# Patient Record
Sex: Female | Born: 1980 | Hispanic: Yes | Marital: Married | Smoking: Never smoker
Health system: Southern US, Community
[De-identification: ages and names within clinical notes are randomized; demographics above are authoritative.]

---

## 2015-02-15 ENCOUNTER — Encounter (HOSPITAL_COMMUNITY): Payer: Self-pay | Admitting: Emergency Medicine

## 2015-02-15 ENCOUNTER — Emergency Department (HOSPITAL_COMMUNITY)
Admission: EM | Admit: 2015-02-15 | Discharge: 2015-02-15 | Disposition: A | Discharge status: Home / Self Care | Payer: Self-pay | Attending: Emergency Medicine | Admitting: Emergency Medicine

## 2015-02-15 ENCOUNTER — Emergency Department (HOSPITAL_COMMUNITY): Payer: Self-pay

## 2015-02-15 DIAGNOSIS — R5383 Other fatigue: Secondary | ICD-10-CM | POA: Insufficient documentation

## 2015-02-15 DIAGNOSIS — R509 Fever, unspecified: Secondary | ICD-10-CM | POA: Insufficient documentation

## 2015-02-15 DIAGNOSIS — Z3202 Encounter for pregnancy test, result negative: Secondary | ICD-10-CM | POA: Insufficient documentation

## 2015-02-15 DIAGNOSIS — R519 Headache, unspecified: Secondary | ICD-10-CM

## 2015-02-15 DIAGNOSIS — R51 Headache: Secondary | ICD-10-CM | POA: Insufficient documentation

## 2015-02-15 DIAGNOSIS — R079 Chest pain, unspecified: Secondary | ICD-10-CM | POA: Insufficient documentation

## 2015-02-15 DIAGNOSIS — Z7982 Long term (current) use of aspirin: Secondary | ICD-10-CM | POA: Insufficient documentation

## 2015-02-15 LAB — URINALYSIS, ROUTINE W REFLEX MICROSCOPIC
Bilirubin Urine: NEGATIVE
Glucose, UA: NEGATIVE mg/dL
Hgb urine dipstick: NEGATIVE
KETONES UR: NEGATIVE mg/dL
NITRITE: NEGATIVE
PH: 6.5 (ref 5.0–8.0)
PROTEIN: NEGATIVE mg/dL
Specific Gravity, Urine: 1.022 (ref 1.005–1.030)

## 2015-02-15 LAB — BASIC METABOLIC PANEL
Anion gap: 10 (ref 5–15)
BUN: 10 mg/dL (ref 6–20)
CHLORIDE: 104 mmol/L (ref 101–111)
CO2: 26 mmol/L (ref 22–32)
Calcium: 9.4 mg/dL (ref 8.9–10.3)
Creatinine, Ser: 0.73 mg/dL (ref 0.44–1.00)
GFR calc Af Amer: >60 mL/min (ref >60)
GFR calc non Af Amer: >60 mL/min (ref >60)
Glucose, Bld: 102 mg/dL — ABNORMAL HIGH (ref 65–99)
POTASSIUM: 4 mmol/L (ref 3.5–5.1)
SODIUM: 140 mmol/L (ref 135–145)

## 2015-02-15 LAB — CBC
HEMATOCRIT: 41.2 % (ref 36.0–46.0)
HEMOGLOBIN: 14.1 g/dL (ref 12.0–15.0)
MCH: 31.3 pg (ref 26.0–34.0)
MCHC: 34.2 g/dL (ref 30.0–36.0)
MCV: 91.6 fL (ref 78.0–100.0)
Platelets: 183 10*3/uL (ref 150–400)
RBC: 4.5 MIL/uL (ref 3.87–5.11)
RDW: 12.8 % (ref 11.5–15.5)
WBC: 5.4 10*3/uL (ref 4.0–10.5)

## 2015-02-15 LAB — I-STAT BETA HCG BLOOD, ED (MC, WL, AP ONLY)

## 2015-02-15 LAB — URINE MICROSCOPIC-ADD ON
RBC / HPF: NONE SEEN RBC/hpf (ref 0–5)
WBC UA: NONE SEEN WBC/hpf (ref 0–5)

## 2015-02-15 LAB — I-STAT TROPONIN, ED: Troponin i, poc: 0 ng/mL (ref 0.00–0.08)

## 2015-02-15 MED ORDER — METOCLOPRAMIDE HCL 5 MG/ML IJ SOLN
10.0000 mg | Freq: Once | INTRAMUSCULAR | Status: AC
  Administered 2015-02-15: 10 mg via INTRAVENOUS
  Filled 2015-02-15: qty 2

## 2015-02-15 MED ORDER — SODIUM CHLORIDE 0.9 % IV BOLUS (SEPSIS)
1000.0000 mL | Freq: Once | INTRAVENOUS | Status: AC
  Administered 2015-02-15: 1000 mL via INTRAVENOUS

## 2015-02-15 MED ORDER — KETOROLAC TROMETHAMINE 30 MG/ML IJ SOLN
30.0000 mg | Freq: Once | INTRAMUSCULAR | Status: AC
  Administered 2015-02-15: 30 mg via INTRAVENOUS
  Filled 2015-02-15: qty 1

## 2015-02-15 NOTE — ED Notes (Signed)
Via Interpreter phone: 3 weeks of pain all over body and headaches; yesterday a small pain in left side of chest. There has been no additional episodes of the chest pain. Denies any SOB. Denies any CP or SOB with exertion. States when she has the headache she only wants to lie in bed, feels weak all over. Denies one sided weakness, denies vision changes. Reports bilateral hand numbness only at night. Denies abdominal pain.

## 2015-02-15 NOTE — Discharge Instructions (Signed)
Dolor de cabeza general sin causa (General Headache Without Cause) El dolor de cabeza es un dolor o malestar que se siente en la zona de la cabeza o del cuello. Puede no tener una causa especfica. Hay muchas causas y tipos de dolores de [place]. Los dolores de cabeza ms comunes son los siguientes:  Cefalea tensional.  Cefaleas migraosas.  Cefalea en brotes.  Cefaleas diarias crnicas. INSTRUCCIONES PARA EL CUIDADO EN EL HOGAR  Controle su afeccin para ver si hay cambios. Siga estos pasos para Scientist, physiological afeccin: Control del [employer] medicamentos de venta libre y los recetados solamente como se lo haya indicado el mdico.  Cuando sienta dolor de cabeza acustese en un cuarto oscuro y tranquilo.  Si se lo indican, aplique hielo sobre la cabeza y la zona del cuello:  Ponga el hielo en una bolsa plstica.  Coloque una toalla entre la piel y la bolsa de hielo.  Coloque el hielo durante , 2 a 3veces por Futures trader.  Utilice una almohadilla trmica o tome una ducha con agua caliente para aplicar calor en la cabeza y la zona del cuello como se lo haya indicado el mdico.  Mantenga las luces tenues si le [employer] luces brillantes o sus dolores de cabeza empeoran. Comida y bebida  Mantenga un horario para las comidas.  Limite el consumo de bebidas alcohlicas.  Consuma menos cantidad de cafena o deje de tomarla. Instrucciones generales  Concurra a todas las visitas de control como se lo haya indicado el mdico. Esto es importante.  Lleve un diario de los dolores de cabeza para Financial risk analyst qu factores pueden desencadenarlos. Por ejemplo, escriba los siguientes datos:  Lo que usted come y Estate agent.  Cunto tiempo duerme.  Algn cambio en su dieta o en los medicamentos.  Pruebe algunas tcnicas de relajacin, como los [place].  Limite el estrs.  Sintese con la espalda recta y no tense los msculos.  No consuma productos que contengan tabaco, incluidos  cigarrillos, tabaco de Theatre manager o cigarrillos electrnicos. Si necesita ayuda para dejar de fumar, consulte al mdico.  Haga actividad fsica habitualmente como se lo haya indicado el mdico.  Tenga un horario fijo para dormir. Duerma entre 7 y 9horas o la cantidad de horas que le haya recomendado el mdico. SOLICITE ATENCIN MDICA SI:   Los medicamentos no Materials engineer los sntomas.  Tiene un dolor de cabeza que es diferente del dolor de cabeza habitual.  Tiene nuseas o vmitos.  Tiene fiebre. SOLICITE ATENCIN MDICA DE INMEDIATO SI:   El dolor se hace cada vez ms intenso.  Ha vomitado repetidas veces.  Presenta rigidez en el cuello.  Sufre prdida de la visin.  Tiene problemas para hablar.  Siente dolor en el ojo o en el odo.  Presenta debilidad muscular o prdida del control muscular.  Pierde el equilibrio o tiene problemas para Advertising account planner.  Sufre mareos o se desmaya.  Se siente confundido.   Esta informacin no tiene Theme park manager el consejo del mdico. Asegrese de hacerle al mdico cualquier pregunta que tenga.   Document Released: 10/07/2004 Document Revised: 09/18/2014 Elsevier Interactive Patient Education 2016 [employer].  Dolor de pecho inespecfico (Nonspecific Chest Pain) Suele ser difcil encontrar la causa del dolor de [place]. Siempre existe una posibilidad de que el dolor est relacionado con algo grave, como un infarto de miocardio o un cogulo sanguneo en los pulmones. Hay muchas enfermedades que no son potencialmente mortales que pueden causar dolor de [place]. Es importante  que concurra a las visitas de control con el mdico.  CUIDADOS EN EL HOGAR  Si le recetaron antibiticos, asegrese de terminarlos, incluso si comienza a sentirse mejor.  Evite las [employer] causen dolor de [place].  No consuma ningn producto que contenga tabaco, lo que incluye cigarrillos, tabaco de Theatre manager o Administrator, Civil Service. Si necesita ayuda para  dejar de fumar, consulte al mdico.  No beba alcohol.  Tome los medicamentos solamente como se lo haya indicado el mdico.  Concurra a todas las visitas de control como se lo haya indicado el mdico. Esto es importante. Esto incluye otros estudios si el dolor de pecho no desaparece.  El mdico puede indicarle que mantenga la cabeza levantada (elevada) mientras duerme.  Haga cambios en su estilo de vida segn las indicaciones del mdico. Estos pueden incluir lo siguiente:  Education administrator actividad fsica con regularidad. Pdale al mdico que le sugiera algunas actividades que sean seguras para usted.  Consumir una dieta cardiosaludable. El mdico o un especialista en alimentacin (nutricionista) pueden ayudarlo a que haga elecciones saludables.  Mantener un peso saludable.  Controlar la diabetes, si es necesario.  Reducir las situaciones de estrs. SOLICITE AYUDA SI:  El dolor de pecho no desaparece, incluso despus del tratamiento.  Tiene una erupcin cutnea con ampollas en el pecho.  Tiene fiebre. SOLICITE AYUDA DE INMEDIATO SI:  El dolor en el pecho es ms intenso.  La tos empeora, o expectora sangre.  Siente un dolor intenso en el vientre (abdomen).  Se siente muy dbil.  Pierde el conocimiento (se desmaya).  Tiene escalofros.  Tiene una molestia repentina e inexplicable en el pecho.  Tiene molestias repentinas e [employer], la espalda, el cuello o la [place].  Le falta el aire en cualquier momento.  Comienza a sudar de [place] repentina o la piel se le humedece.  Siente nuseas.  Vomita.  Se siente repentinamente mareado o se desmaya.  Siente que el corazn comienza a latir rpidamente o que se saltea latidos. Estos sntomas pueden Customer service manager. No espere hasta que los sntomas desaparezcan. Solicite atencin mdica de inmediato. Comunquese con el servicio de emergencias de su localidad (911 en los [place]). No conduzca por  sus propios medios [employer].   Esta informacin no tiene Theme park manager el consejo del mdico. Asegrese de hacerle al mdico cualquier pregunta que tenga.   Document Released: 03/26/2008 Document Revised: 01/18/2014 Elsevier Interactive Patient Education [employer]

## 2015-02-15 NOTE — ED Provider Notes (Signed)
CSN: [id number]     Arrival date & time 02/15/15  1147 History   First MD Initiated Contact with Patient 02/15/15 1228     Chief Complaint  Patient presents with  . Chest Pain  . Headache  . Fever     (Consider location/radiation/quality/duration/timing/severity/associated sxs/prior Treatment) HPI History obtained via spanish interpreter.  34 year old female who presents with headache , increased fatigue, an episode of chest pain. She has no significant past medical history. States about 3 weeks ago she began to have generalized fatigue, chills, aches and pains in her muscles. Felt like she had wanted to sleep all of the time and was just very tired. 2 weeks ago began developing daily headaches, that, you ago. States that she tends to notice some more towards the end of the day. Headache described as pressure and soreness in the back of her head , top of her neck, and over  Forehead. Not associated with nausea, vomiting, vision or speech changes, focal numbness or weakness , difficulty walking or vertigo. Does not change with position, but notes that it improved briefly when she got a neck massage from her husband. Yesterday while talking on the phone, had a brief episode of pain in the left side of her chest, that lasted for less than a second and disappeared. Denies any associating syncope or near syncope, diaphoresis, shortness of breath, lower extremity swelling or pain, back pain or abdominal pain. Denies urinary complaints, GI complaints, fever. No melena or hematochezia. No heavy menses. States she has had many increased stressors at home but unsure if there was medical cause of symptoms.  History reviewed. No pertinent past medical history. History reviewed. No pertinent past surgical history. History reviewed. No pertinent family history. Social History  Substance Use Topics  . Smoking status: Never Smoker   . Smokeless tobacco: None  . Alcohol Use: No   OB History    No data  available     Review of Systems 10/14 systems reviewed and are negative other than those stated in the HPI    Allergies  Review of patient's allergies indicates no known allergies.  Home Medications   Prior to Admission medications   Medication Sig Start Date End Date Taking? Authorizing Provider  aspirin 81 MG tablet Take 81 mg by mouth daily as needed for pain.   Yes Historical Provider, MD   BP 109/71 mmHg  Pulse 85  Temp(Src) 97.9 F (36.6 C) (Oral)  Resp 18  Ht  (1.651 m)  Wt 148 lb 9 oz (67.388 kg)  BMI 24.72 kg/m2  SpO2 100%  LMP 01/15/2015 (LMP Unknown) Physical Exam   Physical Exam  Nursing note and vitals reviewed. Constitutional: Well developed, well nourished, non-toxic, and in no acute distress Head: Normocephalic and atraumatic.  Mouth/Throat: Oropharynx is clear and moist.  Neck: Normal range of motion. Neck supple.  No meningismus Cardiovascular: Normal rate and regular rhythm.   Pulmonary/Chest: Effort normal and breath sounds normal.  Abdominal: Soft. There is no tenderness. There is no rebound and no guarding.  Musculoskeletal: Normal range of motion.  Skin: Skin is warm and dry.  Psychiatric: Cooperative Neurological:  Alert, oriented to person, place, time, and situation. Memory grossly in tact. Fluent speech. No dysarthria or aphasia.  Cranial nerves: VF are full. Fundoscopic exam-unable to get good visualization of the discs. Pupils are symmetric, and reactive to light. EOMI without nystagmus. No gaze deviation. Facial muscles symmetric with activation. Sensation to light touch over face in  tact bilaterally. Hearing grossly in tact. Palate elevates symmetrically. Head turn and shoulder shrug are intact. Tongue midline.  Reflexes defered.  Muscle bulk and tone normal. No pronator drift. Moves all extremities symmetrically. Sensation to light touch is in tact throughout in bilateral upper and lower extremities. Coordination reveals no  dysmetria with finger to nose. Gait is narrow-based and steady. Non-ataxic.  ED Course  Procedures (including critical care time) Labs Review Labs Reviewed  BASIC METABOLIC PANEL - Abnormal; Notable for the following:    Glucose, Bld 102 (*)    All other components within normal limits  URINALYSIS, ROUTINE W REFLEX MICROSCOPIC (NOT AT [facility]) - Abnormal; Notable for the following:    Leukocytes, UA SMALL (*)    All other components within normal limits  URINE MICROSCOPIC-ADD ON - Abnormal; Notable for the following:    Squamous Epithelial / LPF 6-30 (*)    Bacteria, UA RARE (*)    All other components within normal limits  CBC  I-STAT TROPOININ, ED  I-STAT BETA HCG BLOOD, ED ([facility], [facility], [facility] ONLY)    Imaging Review Dg Chest 2 View  02/15/2015  CLINICAL DATA:  Headaches for 3 weeks.  Left-sided chest pain. EXAM: CHEST  2 VIEW COMPARISON:  None. FINDINGS: The heart size and mediastinal contours are within normal limits. Both lungs are clear. The visualized skeletal structures are unremarkable. IMPRESSION: No active cardiopulmonary disease. Electronically Signed   By: Richarda Overlie M.D.   On: 02/15/2015 12:39   Ct Head Wo Contrast  02/15/2015  CLINICAL DATA:  Headache x3 weeks EXAM: CT HEAD WITHOUT CONTRAST TECHNIQUE: Contiguous axial images were obtained from the base of the skull through the vertex without intravenous contrast. COMPARISON:  None. FINDINGS: Brain: No evidence of acute infarction, hemorrhage, extra-axial collection, ventriculomegaly, or mass effect. Vascular: No hyperdense vessel or unexpected calcification. Skull: Negative for fracture or focal lesion. Sinuses/Orbits: No acute findings. Other: None. IMPRESSION: 1. Negative for bleed or other acute intracranial process. Electronically Signed   By: Corlis Leak M.D.   On: 02/15/2015 14:04   I have personally reviewed and evaluated these images and lab results as part of my medical decision-making.   EKG Interpretation   Date/Time:   Saturday February 15 2015 11:58:56 EST Ventricular Rate:  77 PR Interval:  128 QRS Duration: 82 QT Interval:  394 QTC Calculation: 445 R Axis:   82 Text Interpretation:  Normal sinus rhythm Normal ECG Confirmed by Sarely Stracener MD,  Annabelle Harman ([id number]) on 02/15/2015 2:26:52 PM      MDM   Final diagnoses:  Acute nonintractable headache, unspecified headache type  Chest pain, unspecified chest pain type    35 year old female , otherwise healthy, who presents with headache, fatigue, and less than 1 second episode of chest pain. Is well-appearing and in no acute distress on presentation. Vital signs are non-concerning. The intact neuro exam. Has a unremarkable cardiopulmonary exam.   in regards to her headache, atypical that it has been daily For the past 2 weeks. CT head was performed, showing no acute intracranial processes. Was given a dose of Toradol and Reglan with IV fluids with relief of her headache.  Features of history not concerning for that of subarachnoid hemorrhage , infectious, or other serious  Etiology at this time.  Seems more related to underlying increased stressors recently.   Chest pain atypical, and less than 1 second. Has not had any recurrence of this pain. Has a  Normal EKG, and normal chest x-ray. Troponin 1  is negative. No significant risk factors for ACS and pain is atypical for that. No other concern for other serious causes of her chest pain either.   Basic blood work reveals no major metabolic or electrolyte derangements. She has normal hemoglobin. No concern for infection.   at this time, there appears to be no serious etiology of her symptoms. I discussed supportive care for home. Strict return follow-up instructions are reviewed. She expressed understanding of all discharge instructions and felt comfortable with the plan of care.   Lavera Guise, MD 02/15/15 [phone]

## 2015-02-15 NOTE — ED Notes (Signed)
Pt c/o headache x 3 weeks, left sided chest pain onset last night and numbness to left hand.

## 2017-08-10 IMAGING — CR DG CHEST 2V
2 series · 2 of 2 positions shown · non-contrast
Comparison: None.

CLINICAL DATA: Headaches for 3 weeks.  Left-sided chest pain.

EXAM:
CHEST  2 VIEW

[chest pa]
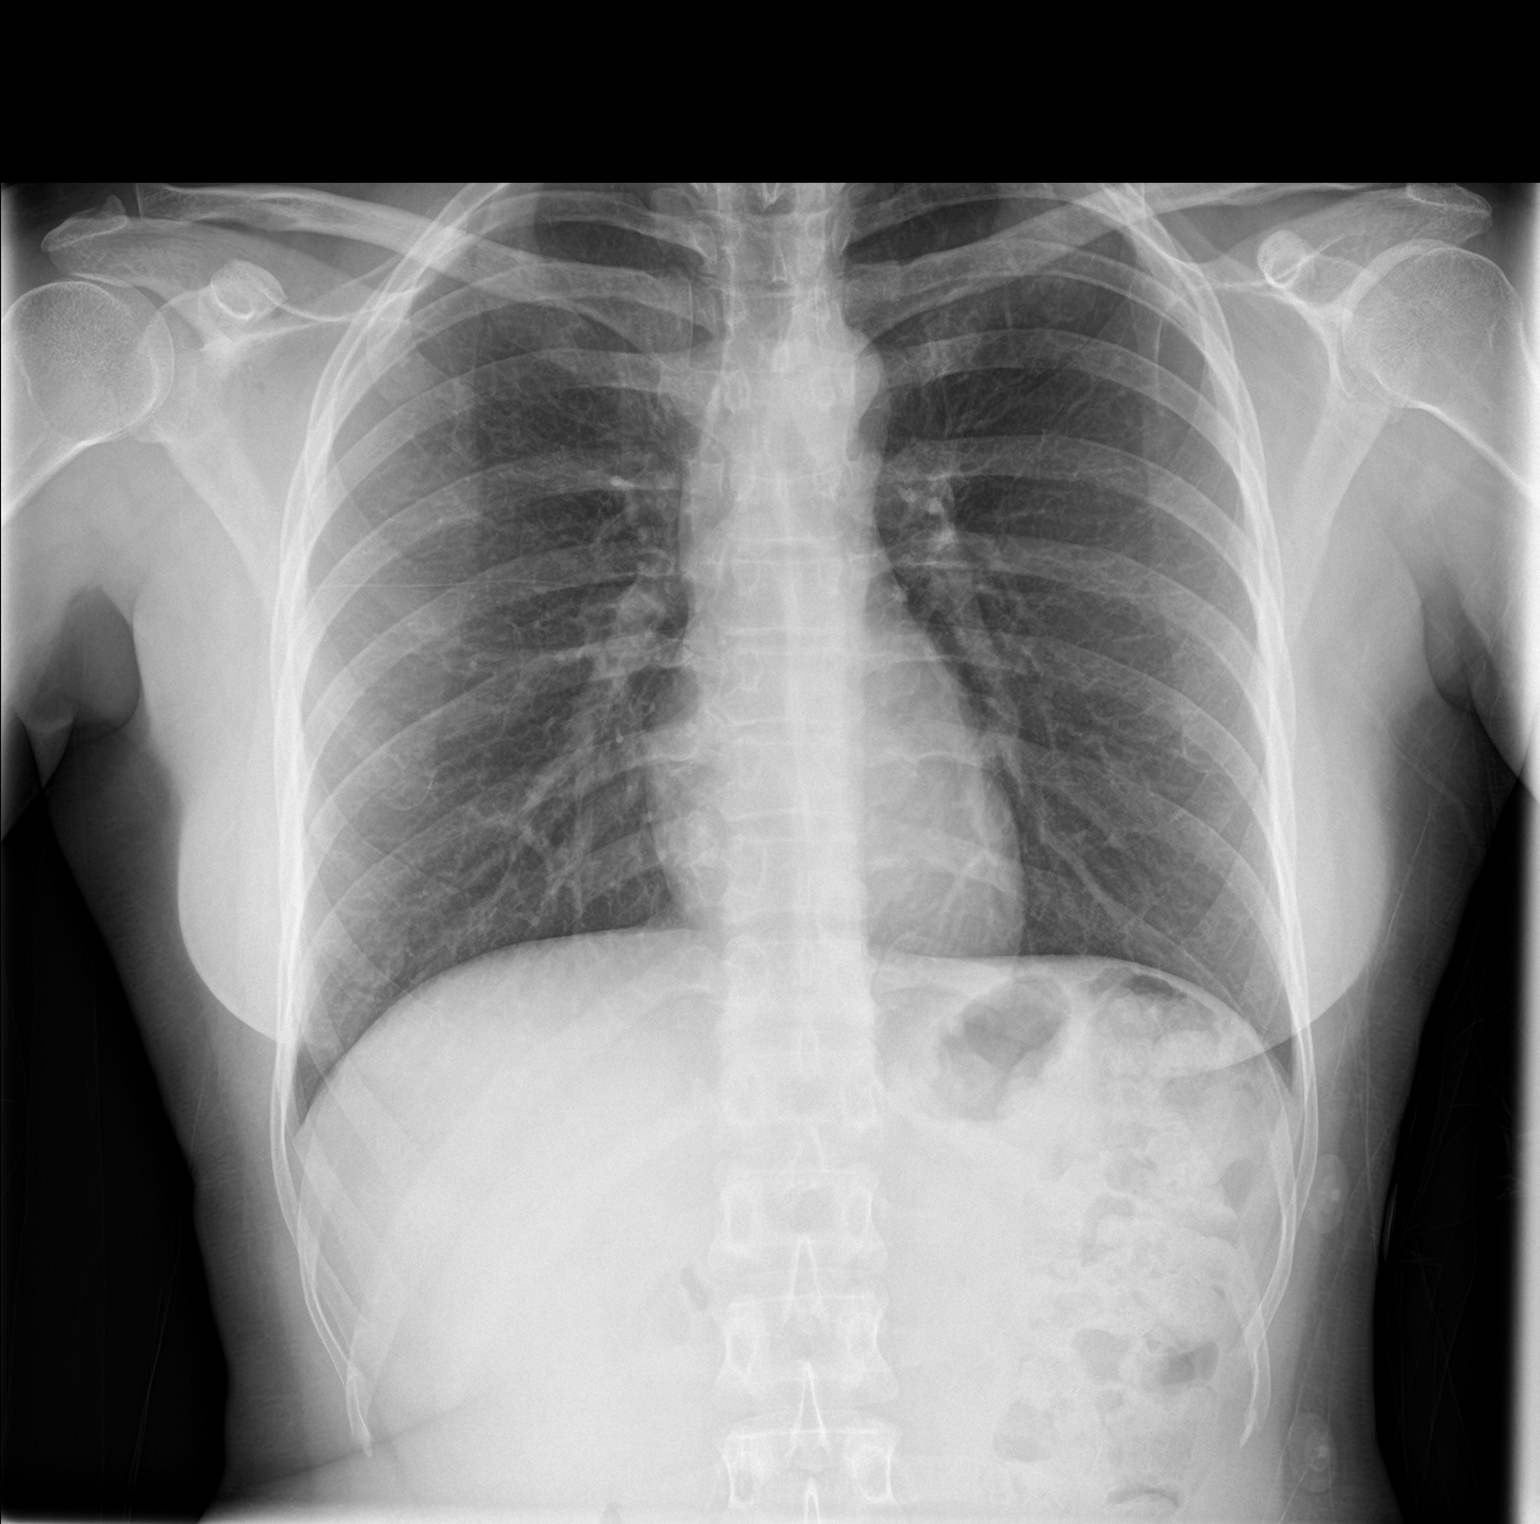

[chest lat]
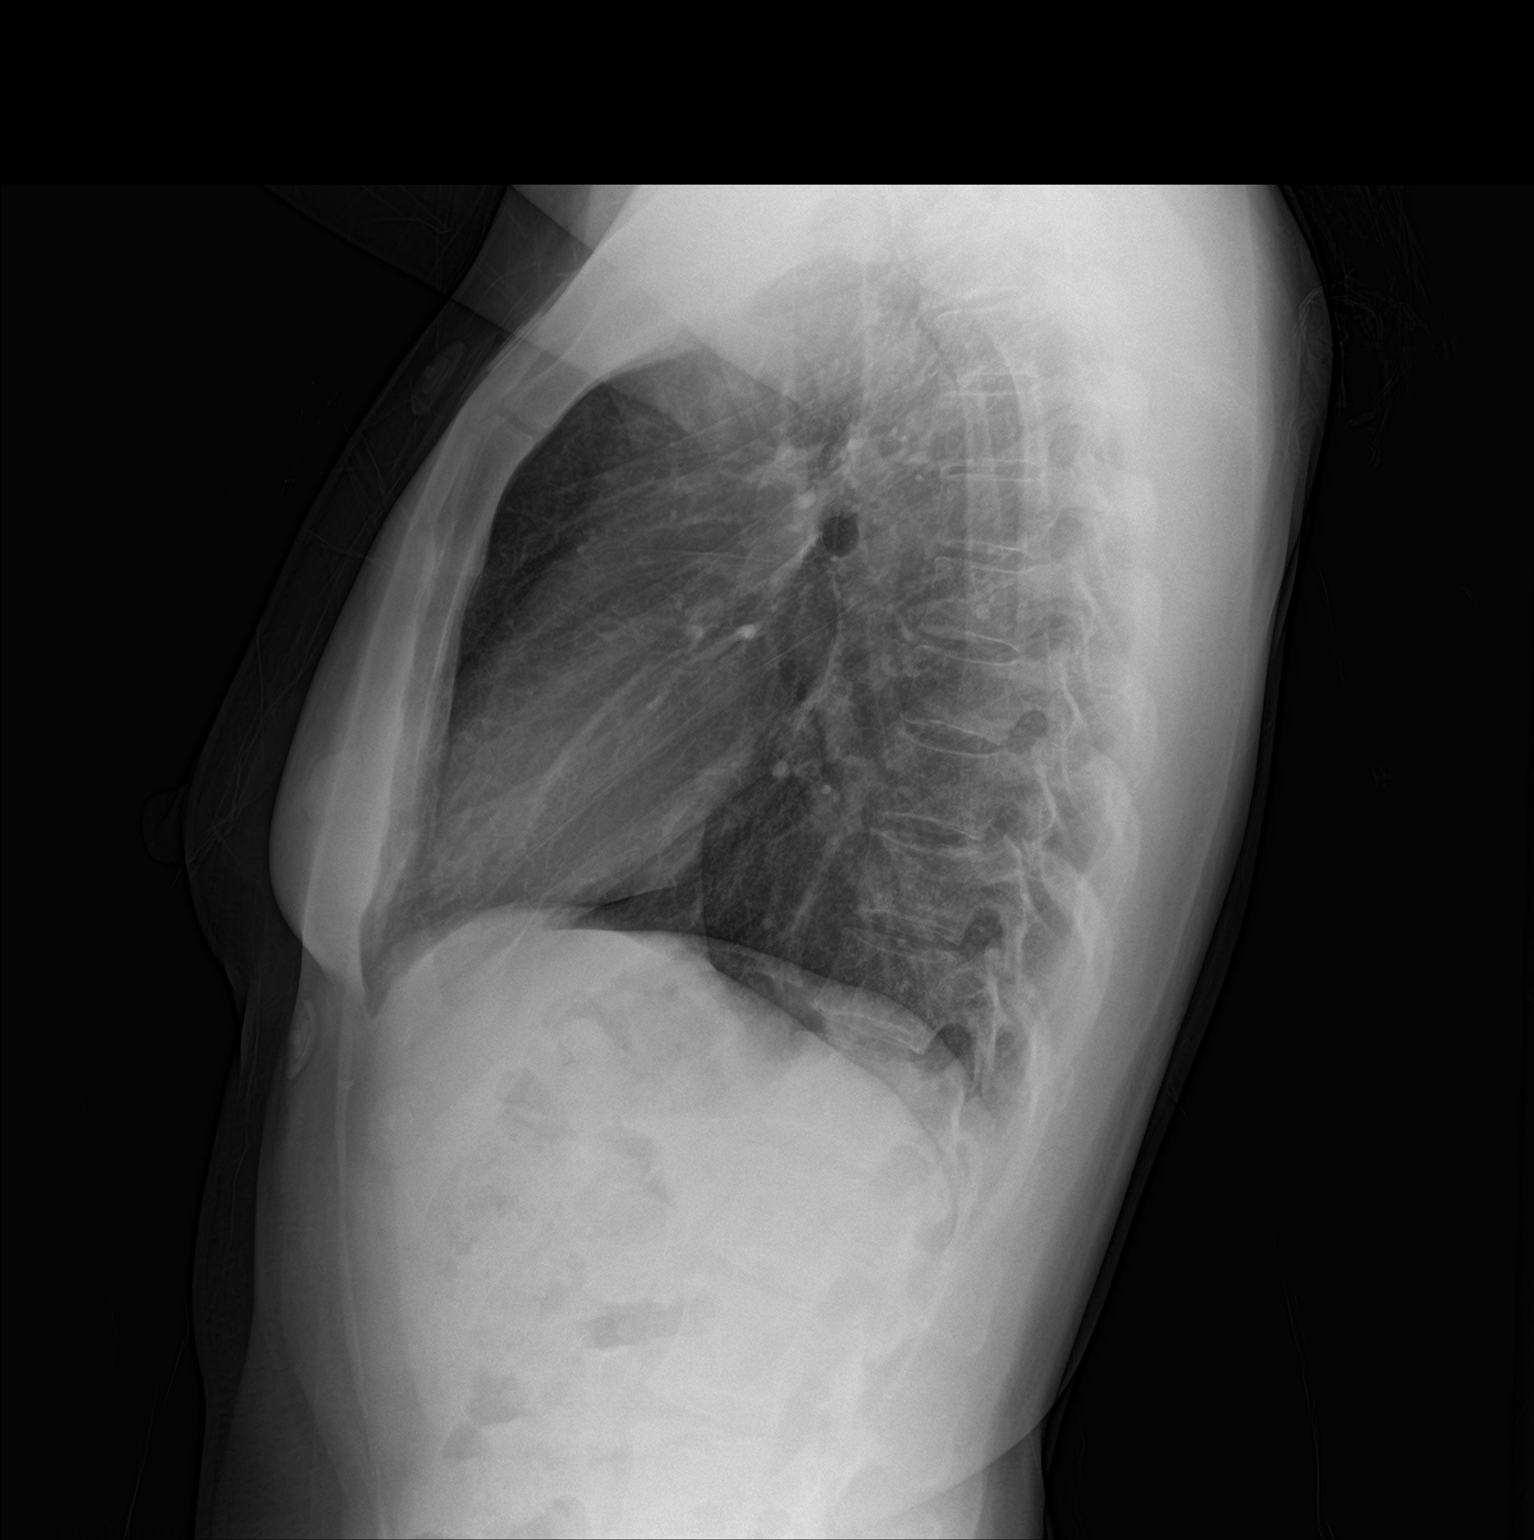

[2 of 2 positions shown; findings below may reference images not displayed]

FINDINGS: The heart size and mediastinal contours are within normal limits.
Both lungs are clear. The visualized skeletal structures are
unremarkable.
IMPRESSION: No active cardiopulmonary disease.

## 2017-08-10 IMAGING — CT CT HEAD W/O CM
2 series · 16 of 30 positions shown, 20 images · non-contrast
Comparison: None.

CLINICAL DATA: Headache x3 weeks

EXAM:
CT HEAD WITHOUT CONTRAST
TECHNIQUE: Contiguous axial images were obtained from the base of the skull
through the vertex without intravenous contrast.

[Series 201: head w/o, idose (1) · axial · non-contrast · 0.49mm/px · z∈[+963,+1083]mm · 13 of 29 slices shown, 17 images]
[im 3/29  brain]
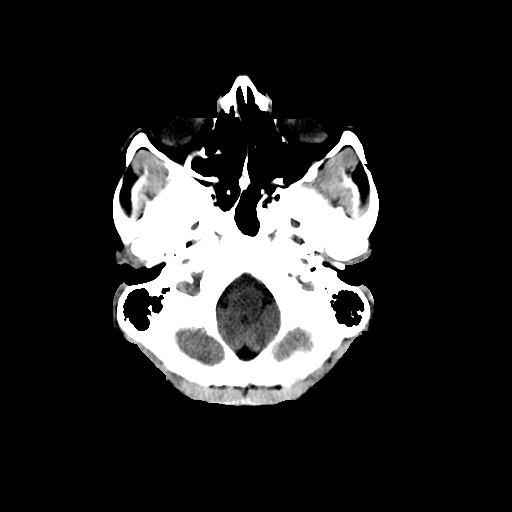
[im 3/29  bone]
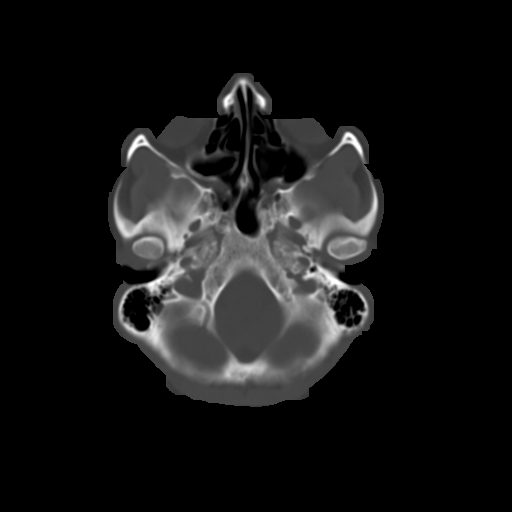
[im 5/29  brain]
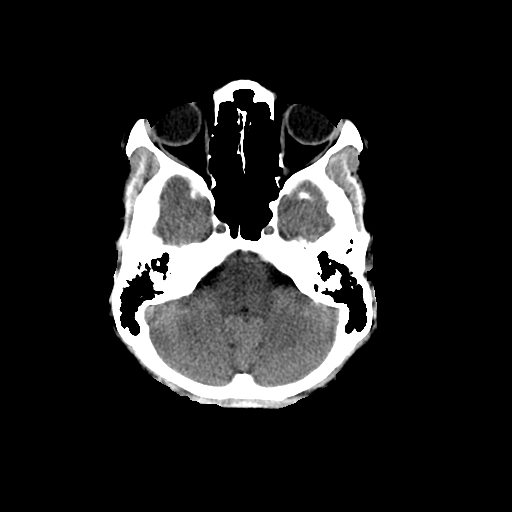
[im 7/29  brain]
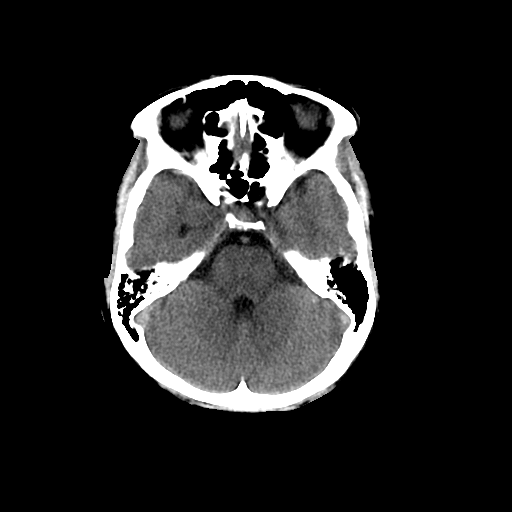
[im 9/29  brain]
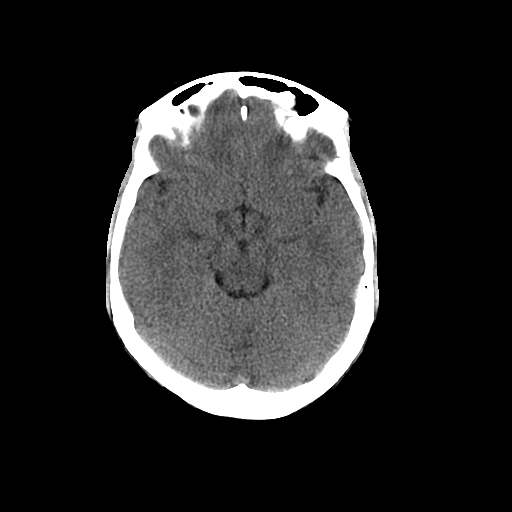
[im 11/29  brain]
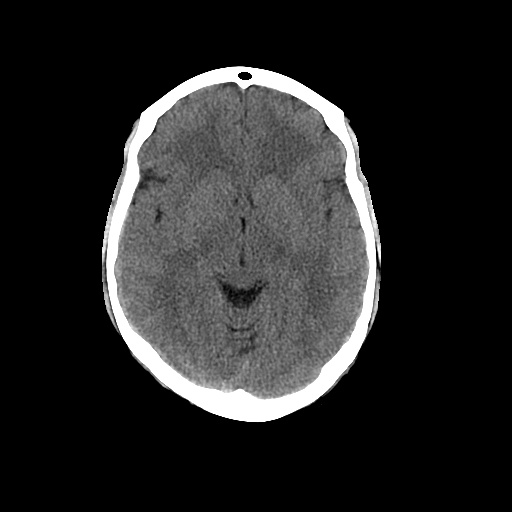
[im 11/29  bone]
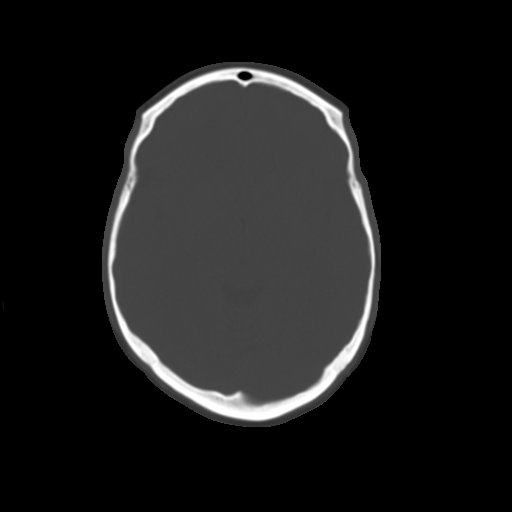
[im 13/29  brain]
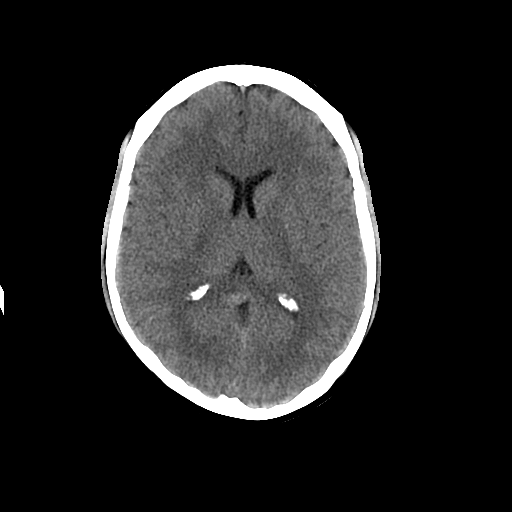
[im 15/29  brain]
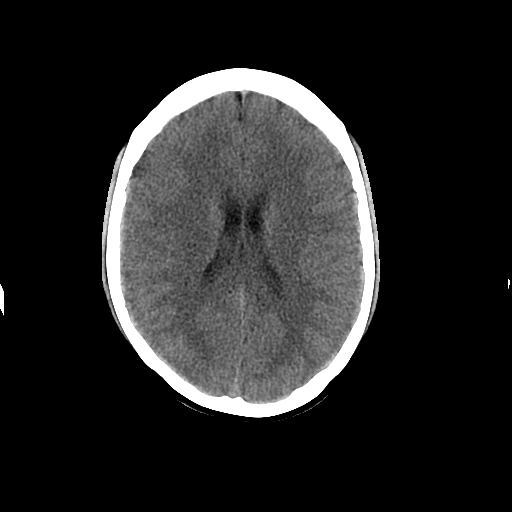
[im 17/29  brain]
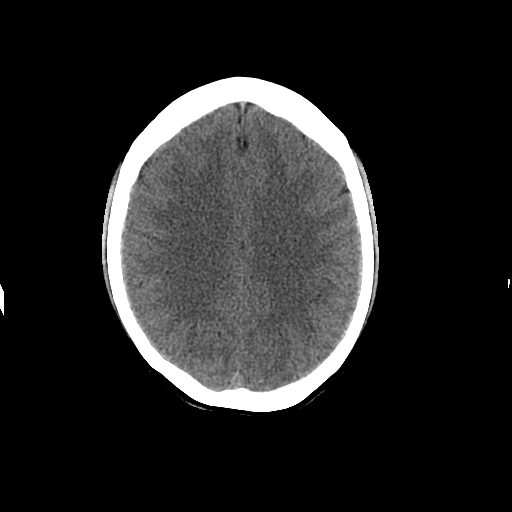
[im 19/29  brain]
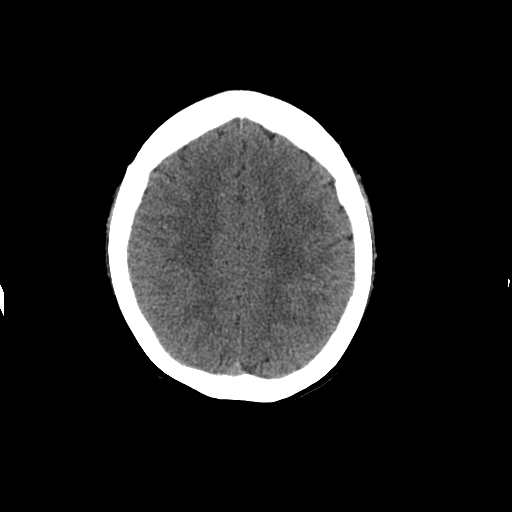
[im 19/29  bone]
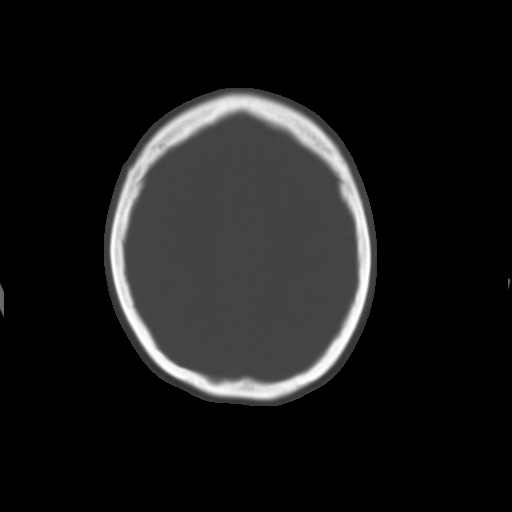
[im 21/29  brain]
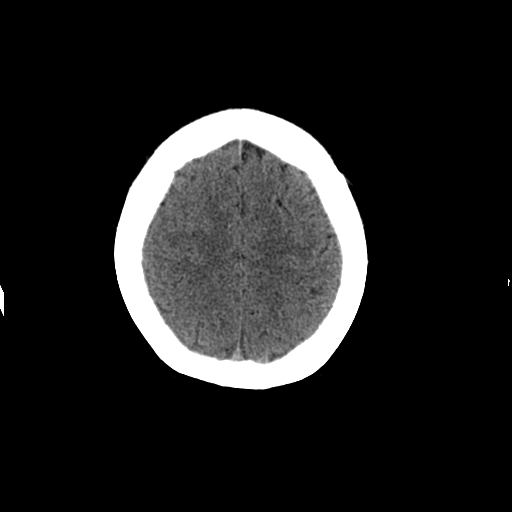
[im 23/29  brain]
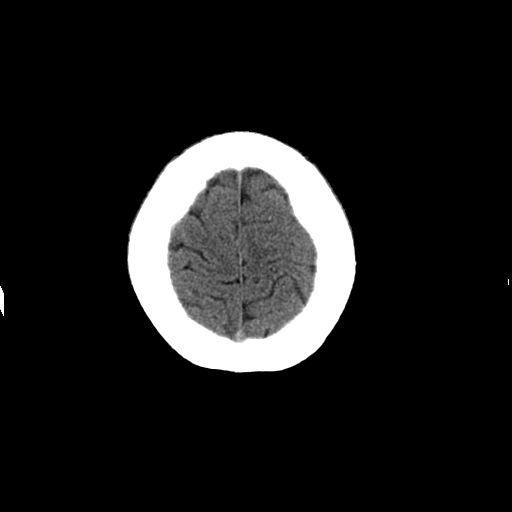
[im 25/29  brain]
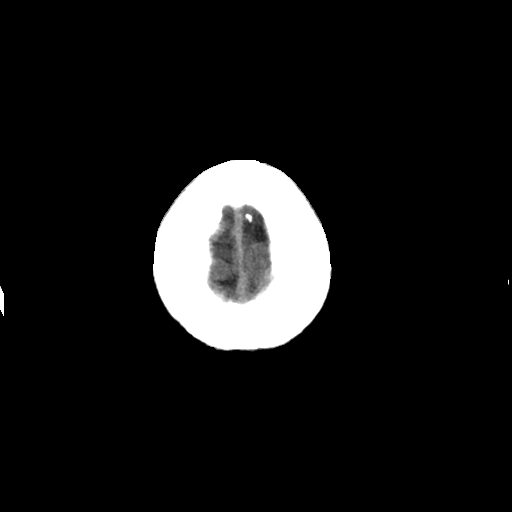
[im 27/29  brain]
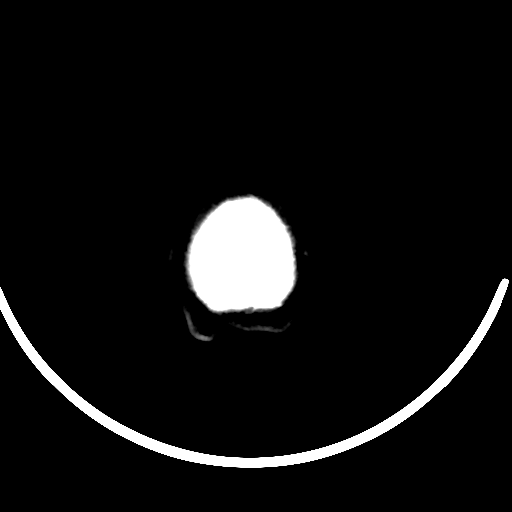
[im 27/29  bone]
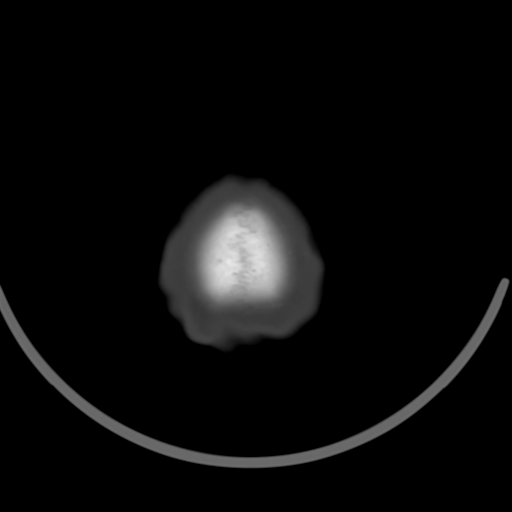

[Series 202: head w/o bone, idose (1) · axial · non-contrast · 0.49mm/px · z∈[+963,+1003]mm · 3 of 29 slices shown]
[im 3/29  bone]
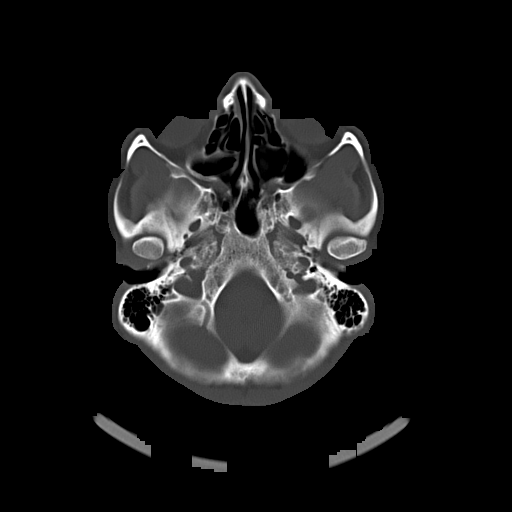
[im 7/29  bone]
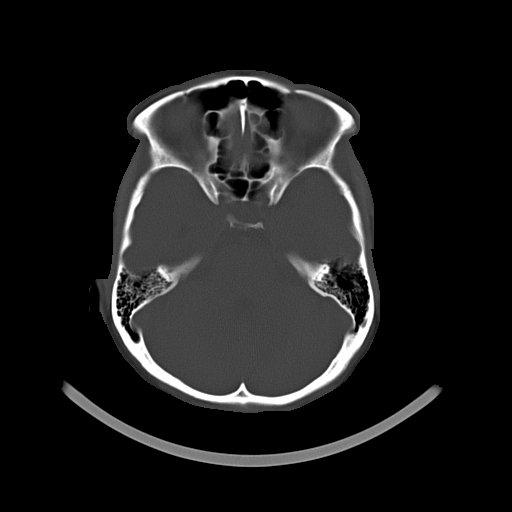
[im 11/29  bone]
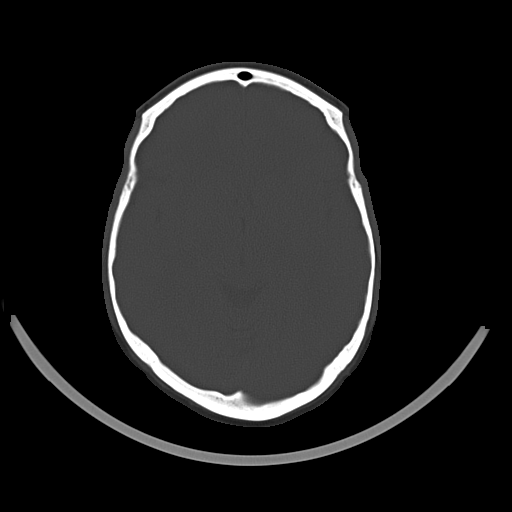

[16 of 30 positions shown; findings below may reference images not displayed]

FINDINGS: Brain: No evidence of acute infarction, hemorrhage, extra-axial
collection, ventriculomegaly, or mass effect.

Vascular: No hyperdense vessel or unexpected calcification.

Skull: Negative for fracture or focal lesion.

Sinuses/Orbits: No acute findings.

Other: None.
IMPRESSION: 1. Negative for bleed or other acute intracranial process.
